# Patient Record
Sex: Female | Born: 1940 | Race: White | Hispanic: No | Marital: Single
Health system: Southern US, Community
[De-identification: ages and names within clinical notes are randomized; demographics above are authoritative.]

---

## 2008-11-04 ENCOUNTER — Emergency Department: Payer: Self-pay | Admitting: Emergency Medicine
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
## 2022-06-03 ENCOUNTER — Ambulatory Visit (INDEPENDENT_AMBULATORY_CARE_PROVIDER_SITE_OTHER): Payer: Medicare Other | Admitting: Podiatry

## 2022-06-03 ENCOUNTER — Ambulatory Visit: Payer: Medicare Other

## 2022-06-03 DIAGNOSIS — L84 Corns and callosities: Secondary | ICD-10-CM

## 2022-06-03 DIAGNOSIS — M21619 Bunion of unspecified foot: Secondary | ICD-10-CM

## 2022-06-03 DIAGNOSIS — M2042 Other hammer toe(s) (acquired), left foot: Secondary | ICD-10-CM

## 2022-06-03 DIAGNOSIS — M2041 Other hammer toe(s) (acquired), right foot: Secondary | ICD-10-CM

## 2022-06-03 NOTE — Progress Notes (Signed)
  Subjective:  Patient ID: Pamela Sandoval, female    DOB: [birth year],  MRN: [id number]  Chief Complaint  Patient presents with   Callouses    Callus to left arch-lateral aspect    82 y.o. female presents with the above complaint. History confirmed with patient. Patient presenting with pain related to callus on left arch. Patient does not have a history of T2DM. Patient does have callus present located at the plantar aspect of the left foot subfourth fifth metatarsal head as well as the plantar heel causing pain.  Patient does have elevated A1c level but states she is not diabetic does not take any medications for her.  She also notes hammertoe deformities on both foot and is hoping for extra-depth shoes.  Objective:  Physical Exam: warm, good capillary refill nail exam onychomycosis of the toenails and dystrophic nails DP pulses palpable, PT pulses palpable, and protective sensation intact Left Foot:  Pain with palpation of hyperkeratotic lesion subfourth and fifth metatarsal head consistent with preulcerative callus.  Hammertoe deformity noted bilateral Right Foot: No significant hyperkeratotic lesions noted hammertoe deformity noted bilateral  Assessment:   1. Callus of foot   2. Hammertoe, bilateral      Plan:  Patient was evaluated and treated and all questions answered.  #Hyperkeratotic lesions/pre ulcerative calluses present subfourth and fifth metatarsal head on the left foot as well as on the left heel plantarly All symptomatic hyperkeratoses x 3 separate lesions were safely debrided with a sterile #10 blade to patient's level of comfort without incident. We discussed preventative and palliative care of these lesions including supportive and accommodative shoegear, padding, prefabricated and custom molded accommodative orthoses, use of a pumice stone and lotions/creams daily.  # Hammertoe bilateral foot -Patient requesting extra-depth shoes -Provided prescription referral  to [facility] for extra-depth shoe manufacture  No follow-ups on file.         Corinna Gab, DPM [facility] / [facility]
